# Patient Record
Sex: Male | Born: 1985 | Hispanic: Yes | Marital: Single
Health system: Southern US, Community
[De-identification: ages and names within clinical notes are randomized; demographics above are authoritative.]

---

## 2012-01-27 ENCOUNTER — Ambulatory Visit: Payer: Self-pay | Admitting: Family Medicine

## 2013-04-06 IMAGING — CR RIGHT FOOT COMPLETE - 3+ VIEW
1 series · 3 of 3 positions shown · non-contrast
Comparison: none

REASON FOR EXAM: rt foot injury
COMMENTS:

PROCEDURE:     MDR - MDR FOOT RT COMP W/OBLIQUES  - January 27, 2012 [DATE]
RESULT:     Fracture the base of the right fifth metatarsal shaft is noted.
Fracture nondisplaced. No other acute abnormality.

[Series 1: ap · 0.17mm/px · 3 of 3 slices shown]
[im 1/3]
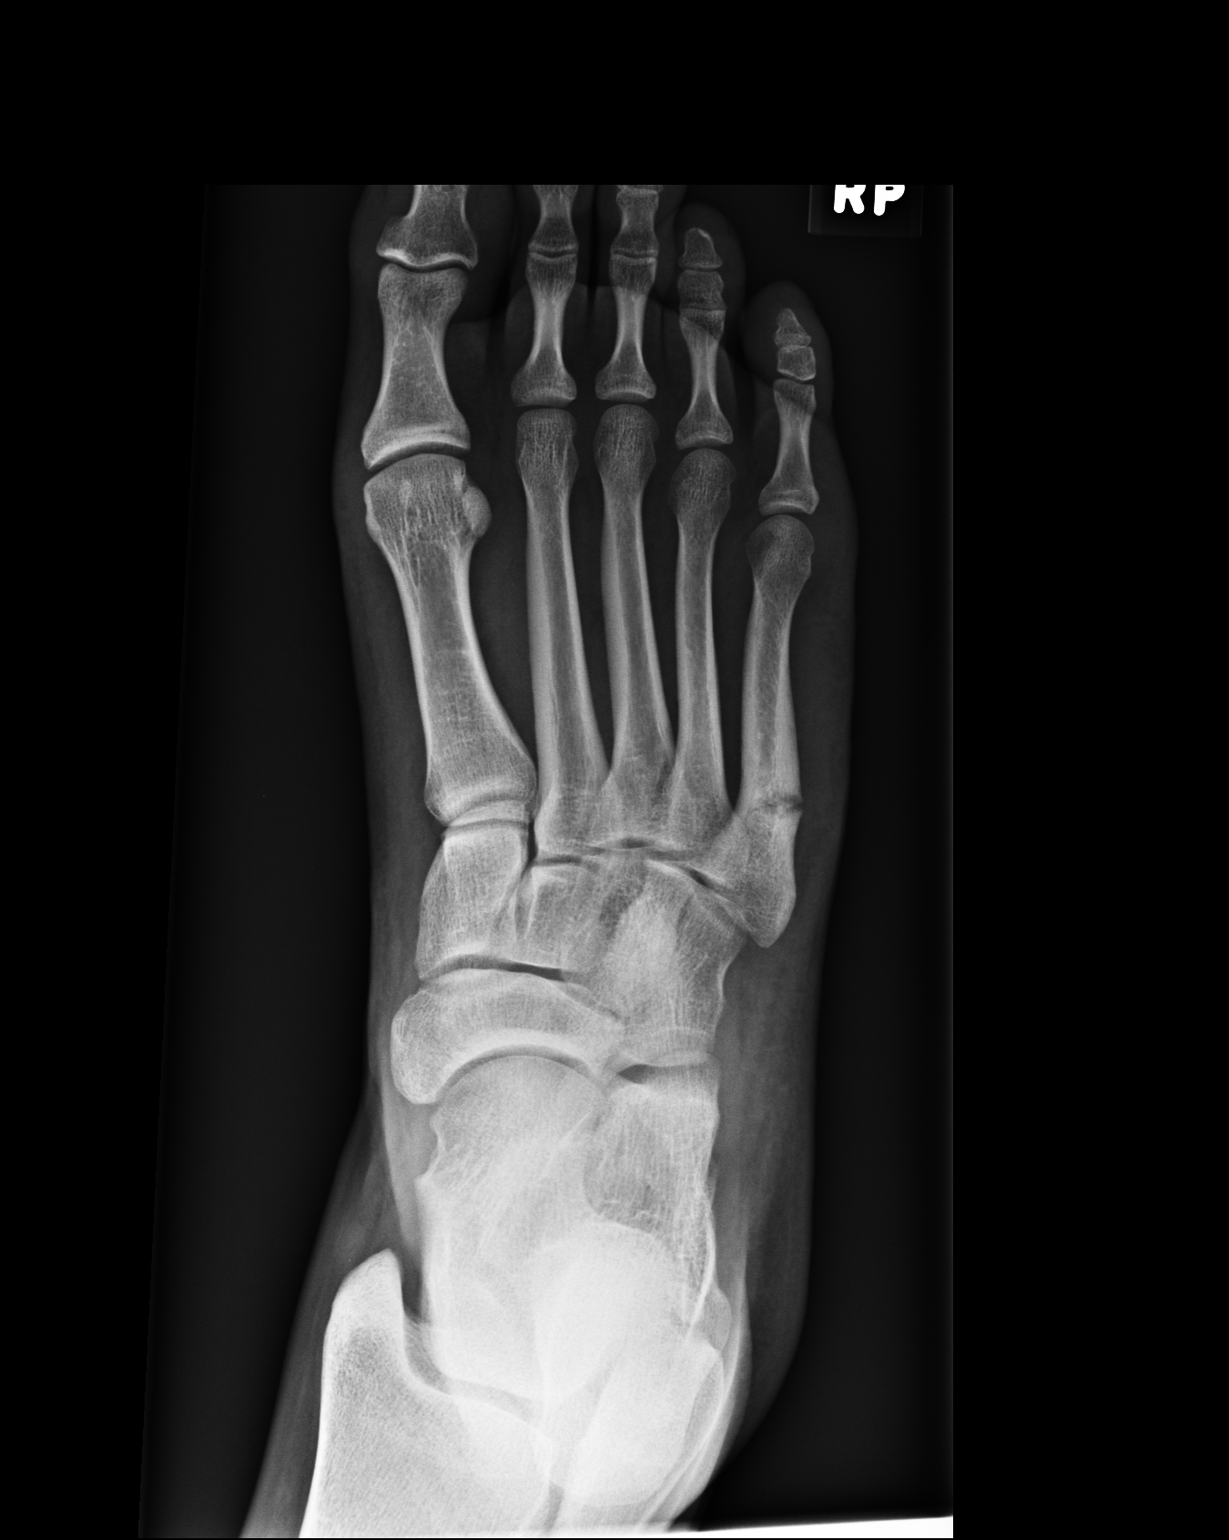
[im 2/3]
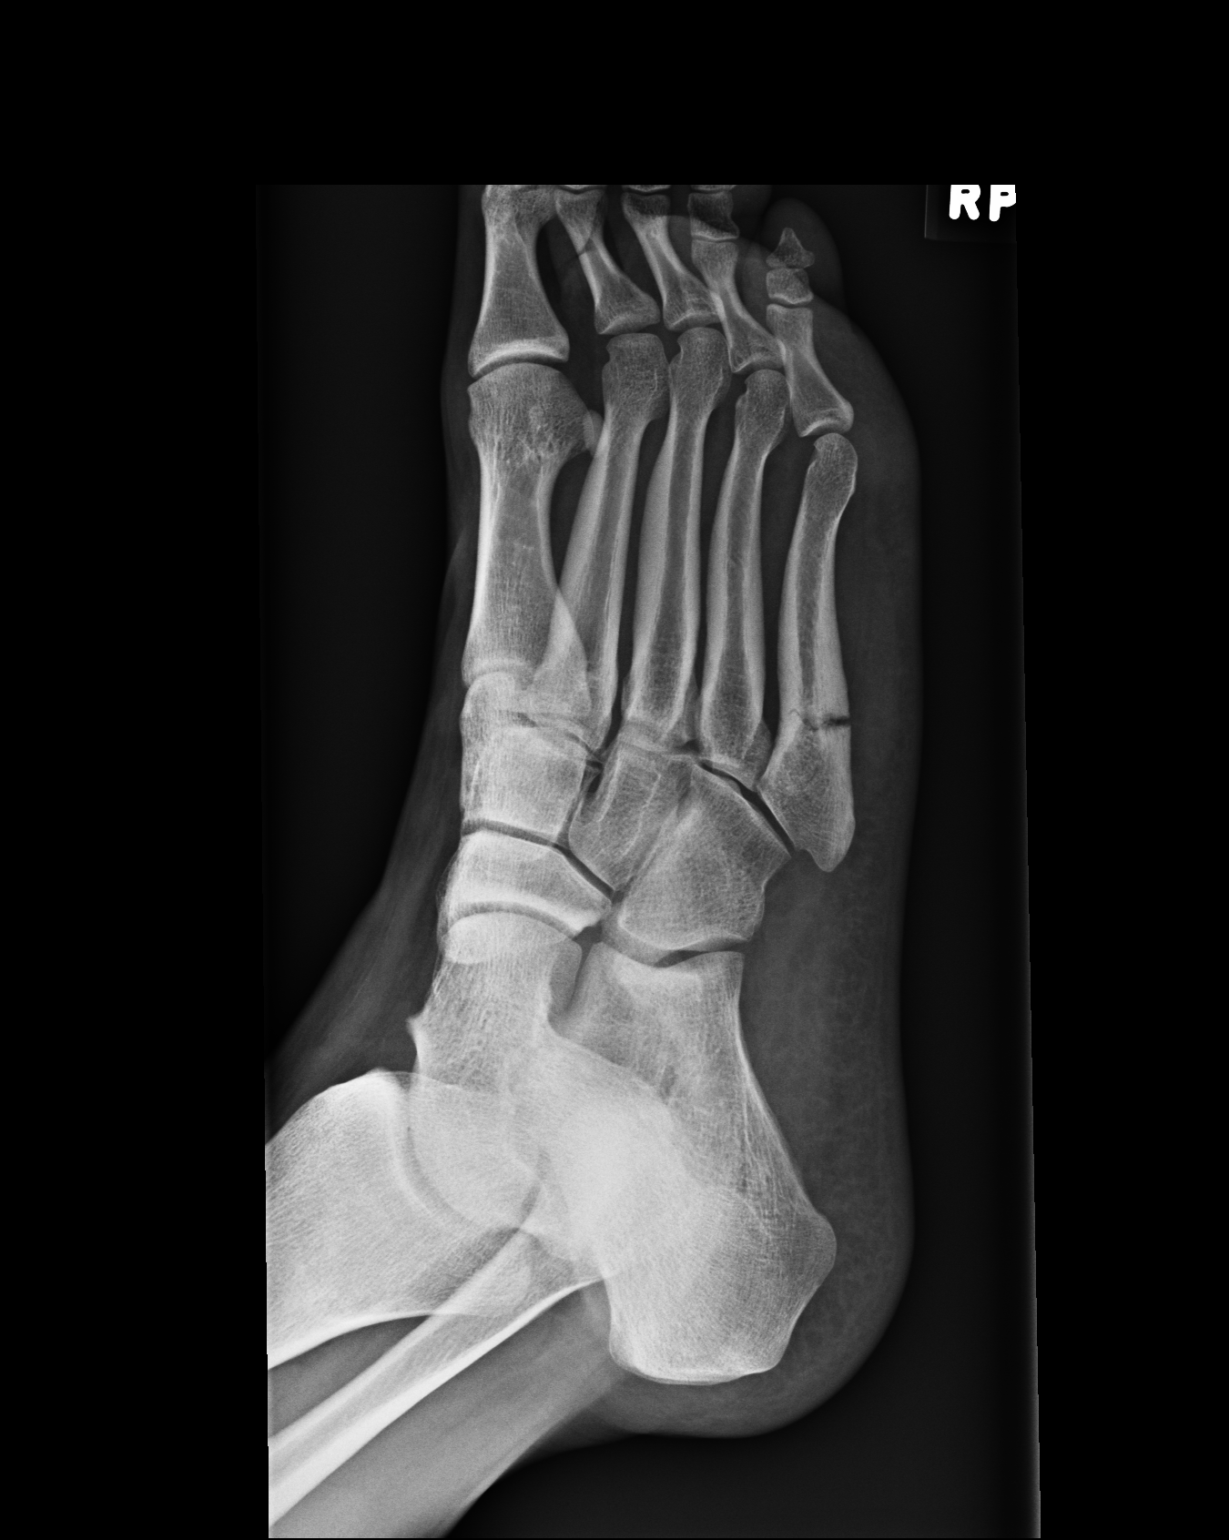
[im 3/3]
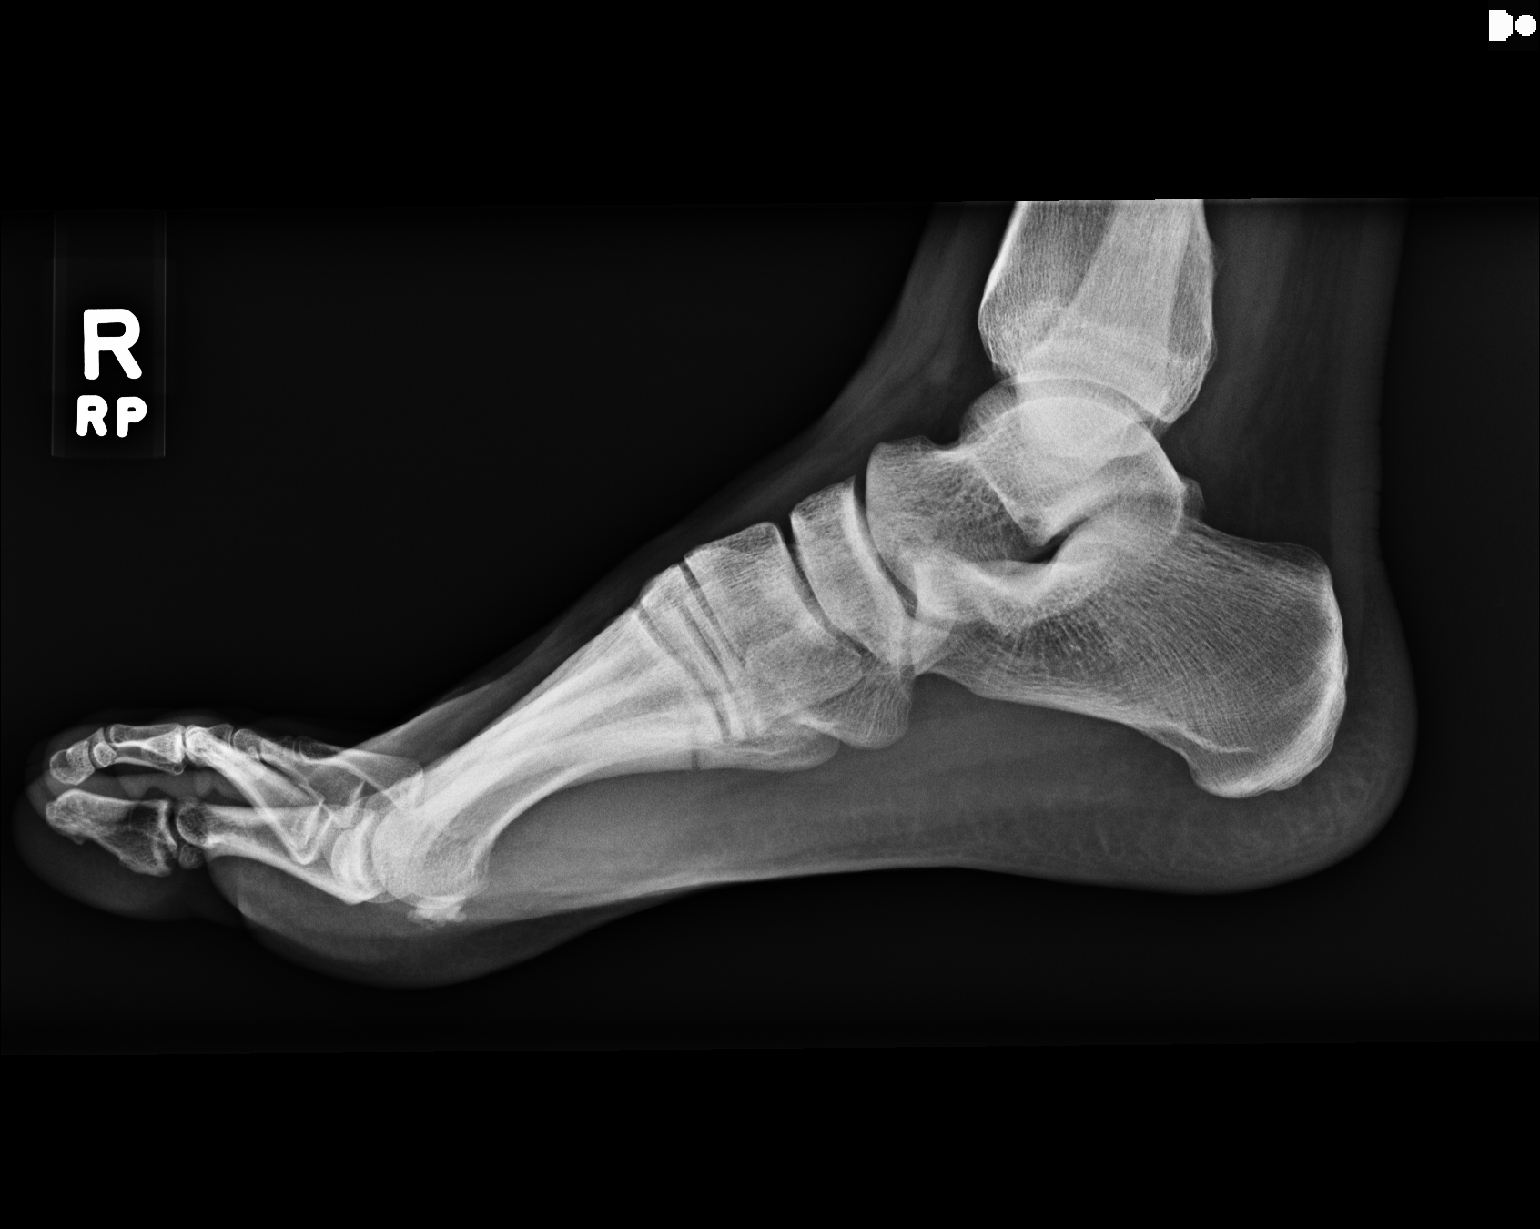

[3 of 3 positions shown; findings below may reference images not displayed]

IMPRESSION: Fracture at the base of the shaft of the right fifth
metatarsal.

## 2020-01-21 ENCOUNTER — Ambulatory Visit: Payer: Self-pay | Attending: Internal Medicine

## 2020-01-21 DIAGNOSIS — Z23 Encounter for immunization: Secondary | ICD-10-CM

## 2020-01-21 NOTE — Progress Notes (Signed)
   Covid-[facility] Vaccination [facility]  Name:  Troy Heath    MRN: [id number] DOB: 19-Sep-1986  01/21/2020  Mr. [name] was observed post Covid-19 immunization for 15 minutes without incident. He was provided with Vaccine Information Sheet and instruction to access the V-Safe system.   Mr. [name] was instructed to call 911 with any severe reactions post vaccine: Marland Kitchen Difficulty breathing  . Swelling of face and throat  . A fast heartbeat  . A bad rash all over body  . Dizziness and weakness   Immunizations Administered    Name Date Dose VIS Date Route   Pfizer COVID-19 Vaccine 01/21/2020 12:30 PM 0.3 mL 10/08/2019 Intramuscular   Manufacturer: [employer], [employer]   Lot: [id number]   NDC: [address]-7

## 2020-02-15 ENCOUNTER — Ambulatory Visit: Payer: Self-pay | Attending: Internal Medicine

## 2020-02-15 DIAGNOSIS — Z23 Encounter for immunization: Secondary | ICD-10-CM

## 2020-02-15 NOTE — Progress Notes (Signed)
   Covid-19 Vaccination Clinic  Name:  ELVAN EBRON    MRN: [id number] DOB: 02-Mar-1986  02/15/2020  Mr. Pettway was observed post Covid-19 immunization for 15 minutes without incident. He was provided with Vaccine Information Sheet and instruction to access the V-Safe system.   Mr. Minus was instructed to call 911 with any severe reactions post vaccine: Marland Kitchen Difficulty breathing  . Swelling of face and throat  . A fast heartbeat  . A bad rash all over body  . Dizziness and weakness   Immunizations Administered    Name Date Dose VIS Date Route   Pfizer COVID-19 Vaccine 02/15/2020  8:49 AM 0.3 mL 12/22/2018 Intramuscular   Manufacturer: [employer], [employer]   Lot: [id number]   NDC: [address]-6

## 2025-07-27 ENCOUNTER — Ambulatory Visit: Admitting: Family Medicine
